# Patient Record
Sex: Female | Born: 1995 | Race: White | Hispanic: Yes | Marital: Single | Smoking: Current some day smoker
Health system: Southern US, Community
[De-identification: ages and names within clinical notes are randomized; demographics above are authoritative.]

---

## 2018-05-09 ENCOUNTER — Ambulatory Visit (HOSPITAL_COMMUNITY)
Admission: EM | Admit: 2018-05-09 | Discharge: 2018-05-09 | Disposition: A | Discharge status: Home / Self Care | Payer: BLUE CROSS/BLUE SHIELD | Attending: Family Medicine | Admitting: Family Medicine

## 2018-05-09 ENCOUNTER — Ambulatory Visit (INDEPENDENT_AMBULATORY_CARE_PROVIDER_SITE_OTHER): Payer: BLUE CROSS/BLUE SHIELD

## 2018-05-09 ENCOUNTER — Other Ambulatory Visit: Payer: Self-pay

## 2018-05-09 ENCOUNTER — Encounter (HOSPITAL_COMMUNITY): Payer: Self-pay | Admitting: Emergency Medicine

## 2018-05-09 DIAGNOSIS — T148XXA Other injury of unspecified body region, initial encounter: Secondary | ICD-10-CM

## 2018-05-09 DIAGNOSIS — M546 Pain in thoracic spine: Secondary | ICD-10-CM | POA: Diagnosis not present

## 2018-05-09 DIAGNOSIS — S29012A Strain of muscle and tendon of back wall of thorax, initial encounter: Secondary | ICD-10-CM | POA: Diagnosis not present

## 2018-05-09 MED ORDER — CYCLOBENZAPRINE HCL 10 MG PO TABS: 5.0000 mg | ORAL_TABLET | Freq: Every day | ORAL | 0 refills | Status: AC

## 2018-05-09 MED ORDER — KETOROLAC TROMETHAMINE 30 MG/ML IJ SOLN
30.0000 mg | Freq: Once | INTRAMUSCULAR | Status: AC
  Administered 2018-05-09: 30 mg via INTRAMUSCULAR

## 2018-05-09 MED ORDER — NAPROXEN 500 MG PO TABS: 500.0000 mg | ORAL_TABLET | Freq: Two times a day (BID) | ORAL | 0 refills | Status: AC

## 2018-05-09 MED ORDER — KETOROLAC TROMETHAMINE 30 MG/ML IJ SOLN
INTRAMUSCULAR | Status: AC
  Filled 2018-05-09: qty 1

## 2018-05-09 NOTE — Discharge Instructions (Signed)
Your x-ray was negative for any abnormalities We gave you a shot of Toradol here for pain and inflammation He can take naproxen twice a day with food for pain inflammation Flexeril at bedtime for muscle relaxant Follow up as needed for continued or worsening symptoms

## 2018-05-09 NOTE — ED Provider Notes (Signed)
[facility]    CSN: [id number] Arrival date & time: 05/09/18  0818     History   Chief Complaint Chief Complaint  Patient presents with  . Back Pain    HPI Jasmine Barton is a 22 y.o. female.   Pt is a 22 year old female that presents with upper back pain in between shoulder blades since yesterday. She was doing hand stands in her bedroom and fell over multiple times striking the back on her bed. Since she has been having constant and worsening pain in the mid/upper thoracic area. She has pain with moving and deep breathing. Some numbness at times when laying flat. No chest pain or SOB. She has not taken anything for her symptoms.   ROS per HPI      History reviewed. No pertinent past medical history.  There are no active problems to display for this patient.   History reviewed. No pertinent surgical history.  OB History   None      Home Medications    Prior to Admission medications   Medication Sig Start Date End Date Taking? Authorizing Provider  cyclobenzaprine (FLEXERIL) 10 MG tablet Take 0.5 tablets (5 mg total) by mouth at bedtime. 05/09/18   Nathin Saran, Gloris Manchester A, NP  naproxen (NAPROSYN) 500 MG tablet Take 1 tablet (500 mg total) by mouth 2 (two) times daily. 05/09/18   Janace Aris, NP    Family History History reviewed. No pertinent family history.  Social History Social History   Tobacco Use  . Smoking status: Current Some Day Smoker    Types: Cigarettes  . Smokeless tobacco: Never Used  Substance Use Topics  . Alcohol use: Yes    Comment: Socailly   . Drug use: Never     Allergies   Zithromax [azithromycin]   Review of Systems Review of Systems   Physical Exam Triage Vital Signs ED Triage Vitals  Enc Vitals Group     BP 05/09/18 0916 128/86     Pulse Rate 05/09/18 0916 77     Resp --      Temp 05/09/18 0916 98.5 F (36.9 C)     Temp Source 05/09/18 0916 Oral     SpO2 05/09/18 0916 97 %     Weight --      Height --        Head Circumference --      Peak Flow --      Pain Score 05/09/18 0909 5     Pain Loc --      Pain Edu? --      Excl. in GC? --    No data found.  Updated Vital Signs BP 128/86 (BP Location: Right Wrist)   Pulse 77   Temp 98.5 F (36.9 C) (Oral)   LMP 05/05/2018 (Exact Date)   SpO2 97%   Visual Acuity Right Eye Distance:   Left Eye Distance:   Bilateral Distance:    Right Eye Near:   Left Eye Near:    Bilateral Near:     Physical Exam  Constitutional: She appears well-developed and well-nourished.  Appears in pain   HENT:  Head: Normocephalic and atraumatic.  Eyes: Conjunctivae are normal.  Neck: Normal range of motion. Neck supple.  Non tender to cervical spine   Cardiovascular: Normal rate, regular rhythm and normal heart sounds.  Pulmonary/Chest: Effort normal and breath sounds normal.  Musculoskeletal:  Tenderness to the mid/thoracic spine and paravertebral musculature. No deformities, bruising, erythema or swelling.  Tenderness with ROM and lifting the arms. Pain upon deep breathing.   Nursing note and vitals reviewed.    UC Treatments / Results  Labs (all labs ordered are listed, but only abnormal results are displayed) Labs Reviewed - No data to display  EKG None  Radiology Dg Thoracic Spine 2 View  Result Date: 05/09/2018 CLINICAL DATA:  Patient states that she was doing a handstand about two days ago and fell backwards onto the hard railing of her bed, continued upper back pain, T-spine pain along level of scapula. Pain with laying flat. Standing lateral spine image obtained. No Hx EXAM: THORACIC SPINE 2 VIEWS COMPARISON:  None. FINDINGS: There is no evidence of thoracic spine fracture. Alignment is normal. No other significant bone abnormalities are identified. IMPRESSION: Negative. Electronically Signed   By: Norva Pavlov M.D.   On: 05/09/2018 10:04    Procedures Procedures (including critical care time)  Medications Ordered in  UC Medications  ketorolac (TORADOL) 30 MG/ML injection 30 mg (30 mg Intramuscular Given 05/09/18 1000)    Initial Impression / Assessment and Plan / UC Course  I have reviewed the triage vital signs and the nursing notes.  Pertinent labs & imaging results that were available during my care of the patient were reviewed by me and considered in my medical decision making (see chart for details).    Muscle strain X ray negative for fracture or abnormality Toradol injection given here in clinic for pain inflammation Naproxen twice daily as needed for pain inflammation Flexeril for muscle relaxant Follow up as needed for continued or worsening symptoms  Final Clinical Impressions(s) / UC Diagnoses   Final diagnoses:  Muscle strain     Discharge Instructions     Your x-ray was negative for any abnormalities We gave you a shot of Toradol here for pain and inflammation He can take naproxen twice a day with food for pain inflammation Flexeril at bedtime for muscle relaxant Follow up as needed for continued or worsening symptoms     ED Prescriptions    Medication Sig Dispense Auth. Provider   naproxen (NAPROSYN) 500 MG tablet Take 1 tablet (500 mg total) by mouth 2 (two) times daily. 30 tablet Orell Hurtado A, NP   cyclobenzaprine (FLEXERIL) 10 MG tablet Take 0.5 tablets (5 mg total) by mouth at bedtime. 20 tablet Dahlia Byes A, NP     Controlled Substance Prescriptions [place] Controlled Substance Registry consulted? Not Applicable   Janace Aris, NP 05/09/18 1055

## 2018-05-09 NOTE — ED Triage Notes (Signed)
Pt stated she was doing a head stand yesterday and fell over. Back pain started between shoulder blades that has not gotten better. Pt has not taken any OTC medications. Triage done by [facility] Student RN

## 2019-10-06 IMAGING — DX DG THORACIC SPINE 2V
2 series · 2 of 2 positions shown · non-contrast
Comparison: None.

CLINICAL DATA: Patient states that she was doing a handstand about
two days ago and fell backwards onto the hard railing of her bed,
continued upper back pain, T-spine pain along level of scapula. Pain
with laying flat. Standing lateral spine image obtained. No Hx

EXAM:
THORACIC SPINE 2 VIEWS

[t-spine ap]
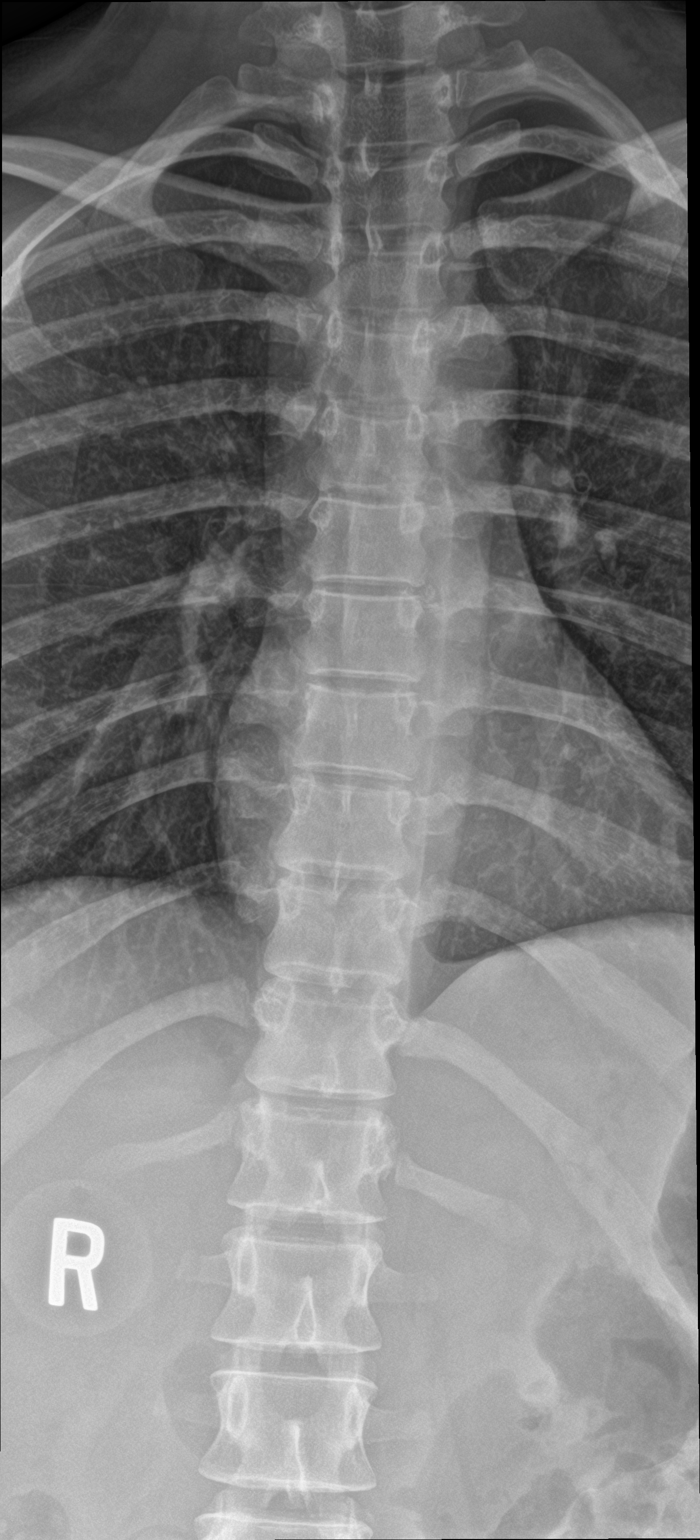

[t-spine lat]
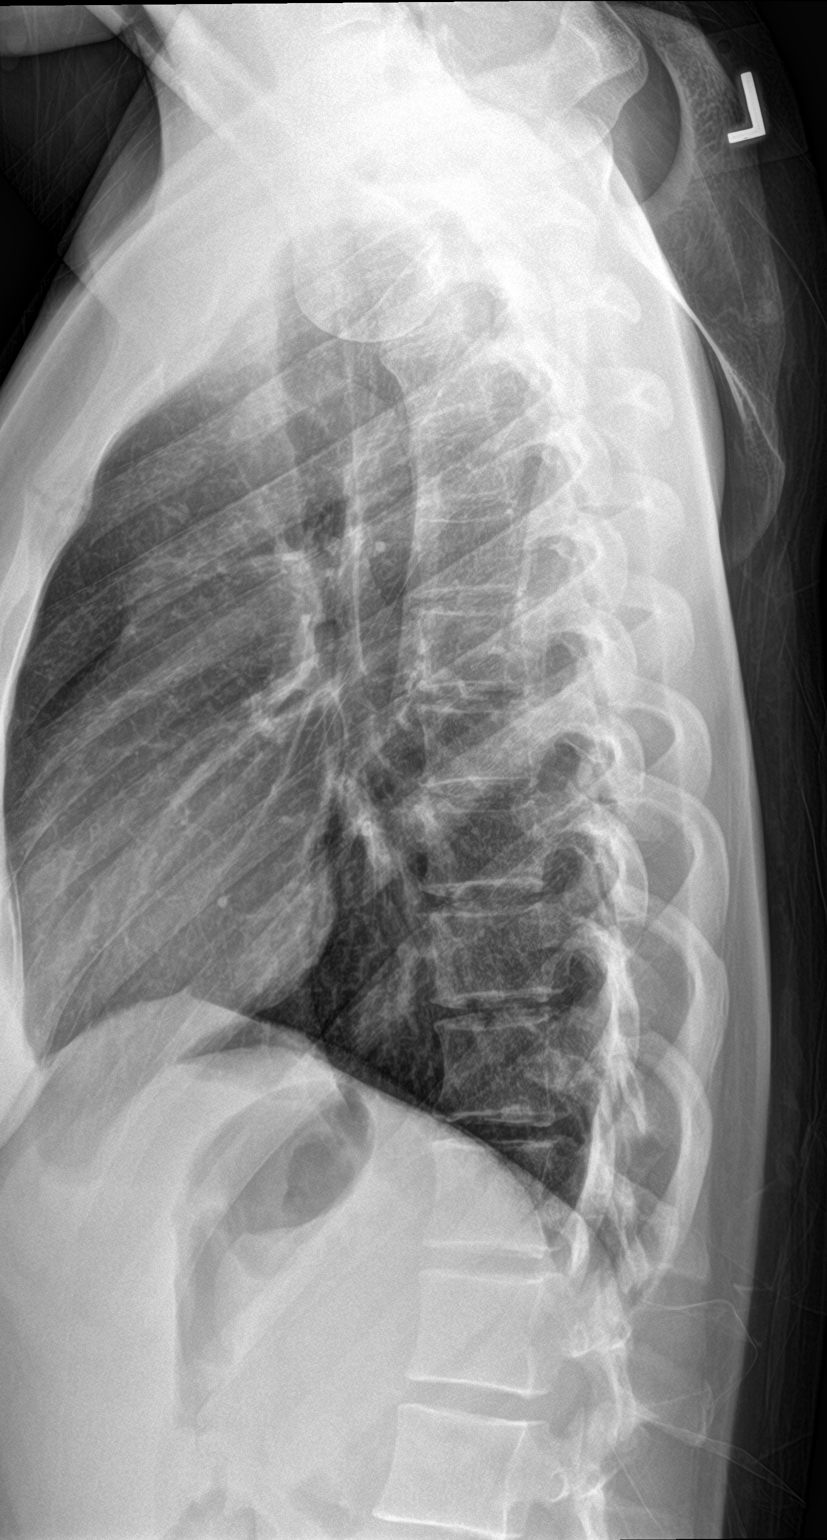

[2 of 2 positions shown; findings below may reference images not displayed]

FINDINGS: There is no evidence of thoracic spine fracture. Alignment is
normal. No other significant bone abnormalities are identified.
IMPRESSION: Negative.
